# Patient Record
Sex: Female | Born: 1975 | Race: White | Hispanic: No | Marital: Married | Smoking: Former smoker
Health system: Southern US, Community
[De-identification: ages and names within clinical notes are randomized; demographics above are authoritative.]

---

## 1998-10-17 ENCOUNTER — Other Ambulatory Visit: Admission: RE | Admit: 1998-10-17 | Discharge: 1998-10-17 | Payer: Self-pay | Admitting: Obstetrics and Gynecology

## 1999-05-04 ENCOUNTER — Other Ambulatory Visit: Admission: RE | Admit: 1999-05-04 | Discharge: 1999-05-04 | Payer: Self-pay | Admitting: Obstetrics and Gynecology

## 1999-11-03 ENCOUNTER — Other Ambulatory Visit: Admission: RE | Admit: 1999-11-03 | Discharge: 1999-11-03 | Payer: Self-pay | Admitting: Obstetrics and Gynecology

## 2000-08-15 ENCOUNTER — Other Ambulatory Visit: Admission: RE | Admit: 2000-08-15 | Discharge: 2000-08-15 | Payer: Self-pay | Admitting: Obstetrics and Gynecology

## 2000-09-06 ENCOUNTER — Encounter (INDEPENDENT_AMBULATORY_CARE_PROVIDER_SITE_OTHER): Payer: Self-pay

## 2000-09-06 ENCOUNTER — Other Ambulatory Visit: Admission: RE | Admit: 2000-09-06 | Discharge: 2000-09-06 | Payer: Self-pay | Admitting: Obstetrics and Gynecology

## 2001-03-21 ENCOUNTER — Other Ambulatory Visit: Admission: RE | Admit: 2001-03-21 | Discharge: 2001-03-21 | Payer: Self-pay | Admitting: Obstetrics and Gynecology

## 2001-12-16 ENCOUNTER — Other Ambulatory Visit: Admission: RE | Admit: 2001-12-16 | Discharge: 2001-12-16 | Payer: Self-pay | Admitting: Obstetrics and Gynecology

## 2003-11-22 ENCOUNTER — Other Ambulatory Visit: Admission: RE | Admit: 2003-11-22 | Discharge: 2003-11-22 | Payer: Self-pay | Admitting: Gynecology

## 2004-06-05 ENCOUNTER — Other Ambulatory Visit: Admission: RE | Admit: 2004-06-05 | Discharge: 2004-06-05 | Payer: Self-pay | Admitting: Gynecology

## 2005-08-06 ENCOUNTER — Other Ambulatory Visit: Admission: RE | Admit: 2005-08-06 | Discharge: 2005-08-06 | Payer: Self-pay | Admitting: Gynecology

## 2005-12-04 ENCOUNTER — Other Ambulatory Visit: Admission: RE | Admit: 2005-12-04 | Discharge: 2005-12-04 | Payer: Self-pay | Admitting: Gynecology

## 2006-10-02 ENCOUNTER — Other Ambulatory Visit: Admission: RE | Admit: 2006-10-02 | Discharge: 2006-10-02 | Payer: Self-pay | Admitting: Gynecology

## 2007-06-25 ENCOUNTER — Other Ambulatory Visit: Admission: RE | Admit: 2007-06-25 | Discharge: 2007-06-25 | Payer: Self-pay | Admitting: Gynecology

## 2009-11-03 ENCOUNTER — Inpatient Hospital Stay (HOSPITAL_COMMUNITY): Admission: AD | Admit: 2009-11-03 | Discharge: 2009-11-03 | Payer: Self-pay | Admitting: Gynecology

## 2009-11-08 ENCOUNTER — Ambulatory Visit: Payer: Self-pay | Admitting: Obstetrics and Gynecology

## 2009-11-08 ENCOUNTER — Ambulatory Visit (HOSPITAL_COMMUNITY): Admission: RE | Admit: 2009-11-08 | Discharge: 2009-11-08 | Payer: Self-pay | Admitting: Gynecology

## 2010-10-03 LAB — CBC
HCT: 35 % — ABNORMAL LOW (ref 36.0–46.0)
Hemoglobin: 12 g/dL (ref 12.0–15.0)
Hemoglobin: 12.8 g/dL (ref 12.0–15.0)
MCHC: 34.3 g/dL (ref 30.0–36.0)
MCHC: 34.7 g/dL (ref 30.0–36.0)
MCV: 91.8 fL (ref 78.0–100.0)
Platelets: 184 10*3/uL (ref 150–400)
RBC: 3.81 MIL/uL — ABNORMAL LOW (ref 3.87–5.11)
RDW: 12.7 % (ref 11.5–15.5)
RDW: 12.9 % (ref 11.5–15.5)
WBC: 4.9 10*3/uL (ref 4.0–10.5)

## 2010-10-03 LAB — CREATININE, SERUM
Creatinine, Ser: 0.85 mg/dL (ref 0.4–1.2)
Creatinine, Ser: 0.85 mg/dL (ref 0.4–1.2)
GFR calc Af Amer: >60 mL/min (ref >60)
GFR calc Af Amer: >60 mL/min (ref >60)
GFR calc non Af Amer: >60 mL/min (ref >60)
GFR calc non Af Amer: >60 mL/min (ref >60)

## 2010-10-03 LAB — DIFFERENTIAL
Basophils Absolute: 0 10*3/uL (ref 0.0–0.1)
Basophils Absolute: 0.1 10*3/uL (ref 0.0–0.1)
Basophils Relative: 1 % (ref 0–1)
Basophils Relative: 1 % (ref 0–1)
Eosinophils Absolute: 0 10*3/uL (ref 0.0–0.7)
Eosinophils Absolute: 0.1 10*3/uL (ref 0.0–0.7)
Eosinophils Relative: 1 % (ref 0–5)
Lymphocytes Relative: 26 % (ref 12–46)
Lymphocytes Relative: 38 % (ref 12–46)
Lymphs Abs: 1.3 10*3/uL (ref 0.7–4.0)
Lymphs Abs: 2.3 10*3/uL (ref 0.7–4.0)
Monocytes Absolute: 0.4 10*3/uL (ref 0.1–1.0)
Monocytes Relative: 7 % (ref 3–12)
Neutro Abs: 3.1 10*3/uL (ref 1.7–7.7)
Neutro Abs: 3.3 10*3/uL (ref 1.7–7.7)
Neutrophils Relative %: 55 % (ref 43–77)

## 2010-10-03 LAB — BUN
BUN: 13 mg/dL (ref 6–23)
BUN: 14 mg/dL (ref 6–23)

## 2010-10-03 LAB — AST
AST: 20 U/L (ref 0–37)
AST: 22 U/L (ref 0–37)

## 2014-09-08 ENCOUNTER — Ambulatory Visit (INDEPENDENT_AMBULATORY_CARE_PROVIDER_SITE_OTHER): Payer: 59 | Admitting: Emergency Medicine

## 2014-09-08 ENCOUNTER — Ambulatory Visit (INDEPENDENT_AMBULATORY_CARE_PROVIDER_SITE_OTHER): Payer: 59

## 2014-09-08 VITALS — BP 114/62 | HR 98 | Temp 98.5°F | Resp 18 | Ht 65.0 in | Wt 146.0 lb

## 2014-09-08 DIAGNOSIS — J029 Acute pharyngitis, unspecified: Secondary | ICD-10-CM

## 2014-09-08 DIAGNOSIS — R5383 Other fatigue: Secondary | ICD-10-CM

## 2014-09-08 LAB — POCT CBC
Granulocyte percent: 65.5 %G (ref 37–80)
HEMATOCRIT: 37.7 % (ref 37.7–47.9)
Hemoglobin: 12.4 g/dL (ref 12.2–16.2)
LYMPH, POC: 2 (ref 0.6–3.4)
MCH, POC: 29.6 pg (ref 27–31.2)
MCHC: 33 g/dL (ref 31.8–35.4)
MCV: 89.6 fL (ref 80–97)
MID (CBC): 0.1 (ref 0–0.9)
MPV: 7.6 fL (ref 0–99.8)
PLATELET COUNT, POC: 242 10*3/uL (ref 142–424)
POC GRANULOCYTE: 3.9 (ref 2–6.9)
POC LYMPH %: 32.5 % (ref 10–50)
POC MID %: 2 %M (ref 0–12)
RBC: 4.21 M/uL (ref 4.04–5.48)
RDW, POC: 12.8 %
WBC: 6 10*3/uL (ref 4.6–10.2)

## 2014-09-08 LAB — POCT URINE PREGNANCY: PREG TEST UR: NEGATIVE

## 2014-09-08 LAB — GLUCOSE, POCT (MANUAL RESULT ENTRY): POC GLUCOSE: 118 mg/dL — AB (ref 70–99)

## 2014-09-08 LAB — POCT RAPID STREP A (OFFICE): Rapid Strep A Screen: NEGATIVE

## 2014-09-08 NOTE — Progress Notes (Signed)
This chart was scribed for Collene Gobble, MD by Tonye Royalty, ED Scribe. This patient was seen in room 12 and the patient's care was started at 2:34 PM.   Subjective:    Patient ID: Garald Ruiz, female    DOB: 1976-05-02, 39 y.o.   MRN: [id number]  HPI  HPI Comments: Tracey Ruiz is a 39 y.o. female who presents to the [facility] complaining of cough with onset a few weeks, worsening this past week.She reports associated fatigue, mild headache, sore throat, head congestion, and generally not feeling well. She states symptoms mostly affect her in the morning but a few times have bothered her during the day. She states she has coughed up phlegm a few times. She has not checked her temperature. She notes hiostry of pneumonia. She reports sick contacts at work. She quit smoking in October. She states she is currently on her period. She states she does not use birth control or any other method of contraception but has a long term partner and they are trying not to get pregnant. She notes ectopic pregnancy 5 years ago.   Review of Systems  Constitutional: Positive for fatigue. Negative for fever.  HENT: Positive for congestion and sore throat.   Respiratory: Positive for cough.   Neurological: Positive for headaches.  All other systems reviewed and are negative.      Objective:   Physical Exam  Nursing note and vitals reviewed.   CONSTITUTIONAL: Well developed/well nourished HEAD: Normocephalic/atraumatic EYES: EOMI/PERRL ENMT: Mucous membranes moist, minimal redness to posterior pharynx NECK: supple no meningeal signs SPINE/BACK:entire spine nontender CV: S1/S2 noted, no murmurs/rubs/gallops noted LUNGS: Lungs are clear to auscultation bilaterally, no apparent distress ABDOMEN: soft, nontender, no rebound or guarding, bowel sounds noted throughout abdomen GU:no cva tenderness NEURO: Pt is awake/alert/appropriate, moves all extremitiesx4.  No facial droop.     EXTREMITIES: pulses normal/equal, full ROM SKIN: warm, color normal PSYCH: no abnormalities of mood noted, alert and oriented to situation  Results for orders placed or performed in visit on 09/08/14  POCT CBC  Result Value Ref Range   WBC 6.0 4.6 - 10.2 K/uL   Lymph, poc 2.0 0.6 - 3.4   POC LYMPH PERCENT 32.5 10 - 50 %L   MID (cbc) 0.1 0 - 0.9   POC MID % 2.0 0 - 12 %M   POC Granulocyte 3.9 2 - 6.9   Granulocyte percent 65.5 37 - 80 %G   RBC 4.21 4.04 - 5.48 M/uL   Hemoglobin 12.4 12.2 - 16.2 g/dL   HCT, POC 40.9 81.1 - 47.9 %   MCV 89.6 80 - 97 fL   MCH, POC 29.6 27 - 31.2 pg   MCHC 33.0 31.8 - 35.4 g/dL   RDW, POC 91.4 %   Platelet Count, POC 242 142 - 424 K/uL   MPV 7.6 0 - 99.8 fL  POCT glucose (manual entry)  Result Value Ref Range   POC Glucose 118 (A) 70 - 99 mg/dl  POCT urine pregnancy  Result Value Ref Range   Preg Test, Ur Negative   POCT rapid strep A  Result Value Ref Range   Rapid Strep A Screen Negative Negative  [facility] reading (PRIMARY) by  Dr. Cleta Alberts no acute disease.     Assessment & Plan:  I suspect patient has had a viral type illness. Her random sugar was up slightly. Her chest x-ray does not show any acute problem and her strep test is  negative we'll await the results of her TSH and comprehensive metabolic panel. I personally performed the services described in this documentation, which was scribed in my presence. The recorded information has been reviewed and is accurate.

## 2014-09-09 LAB — COMPREHENSIVE METABOLIC PANEL
ALT: 14 U/L (ref 0–35)
AST: 14 U/L (ref 0–37)
Albumin: 4.1 g/dL (ref 3.5–5.2)
Alkaline Phosphatase: 39 U/L (ref 39–117)
BUN: 17 mg/dL (ref 6–23)
CO2: 26 mEq/L (ref 19–32)
CREATININE: 1.03 mg/dL (ref 0.50–1.10)
Calcium: 8.9 mg/dL (ref 8.4–10.5)
Chloride: 103 mEq/L (ref 96–112)
Glucose, Bld: 115 mg/dL — ABNORMAL HIGH (ref 70–99)
Potassium: 3.8 mEq/L (ref 3.5–5.3)
SODIUM: 137 meq/L (ref 135–145)
TOTAL PROTEIN: 6.5 g/dL (ref 6.0–8.3)
Total Bilirubin: 0.9 mg/dL (ref 0.2–1.2)

## 2014-09-09 LAB — TSH: TSH: 1.556 u[IU]/mL (ref 0.350–4.500)

## 2015-09-27 IMAGING — CR DG CHEST 1V
1 series · 1 of 1 positions shown · non-contrast
Comparison: None.

CLINICAL DATA: Several weeks of cough and fatigue

EXAM:
CHEST  1 VIEW

[PA]
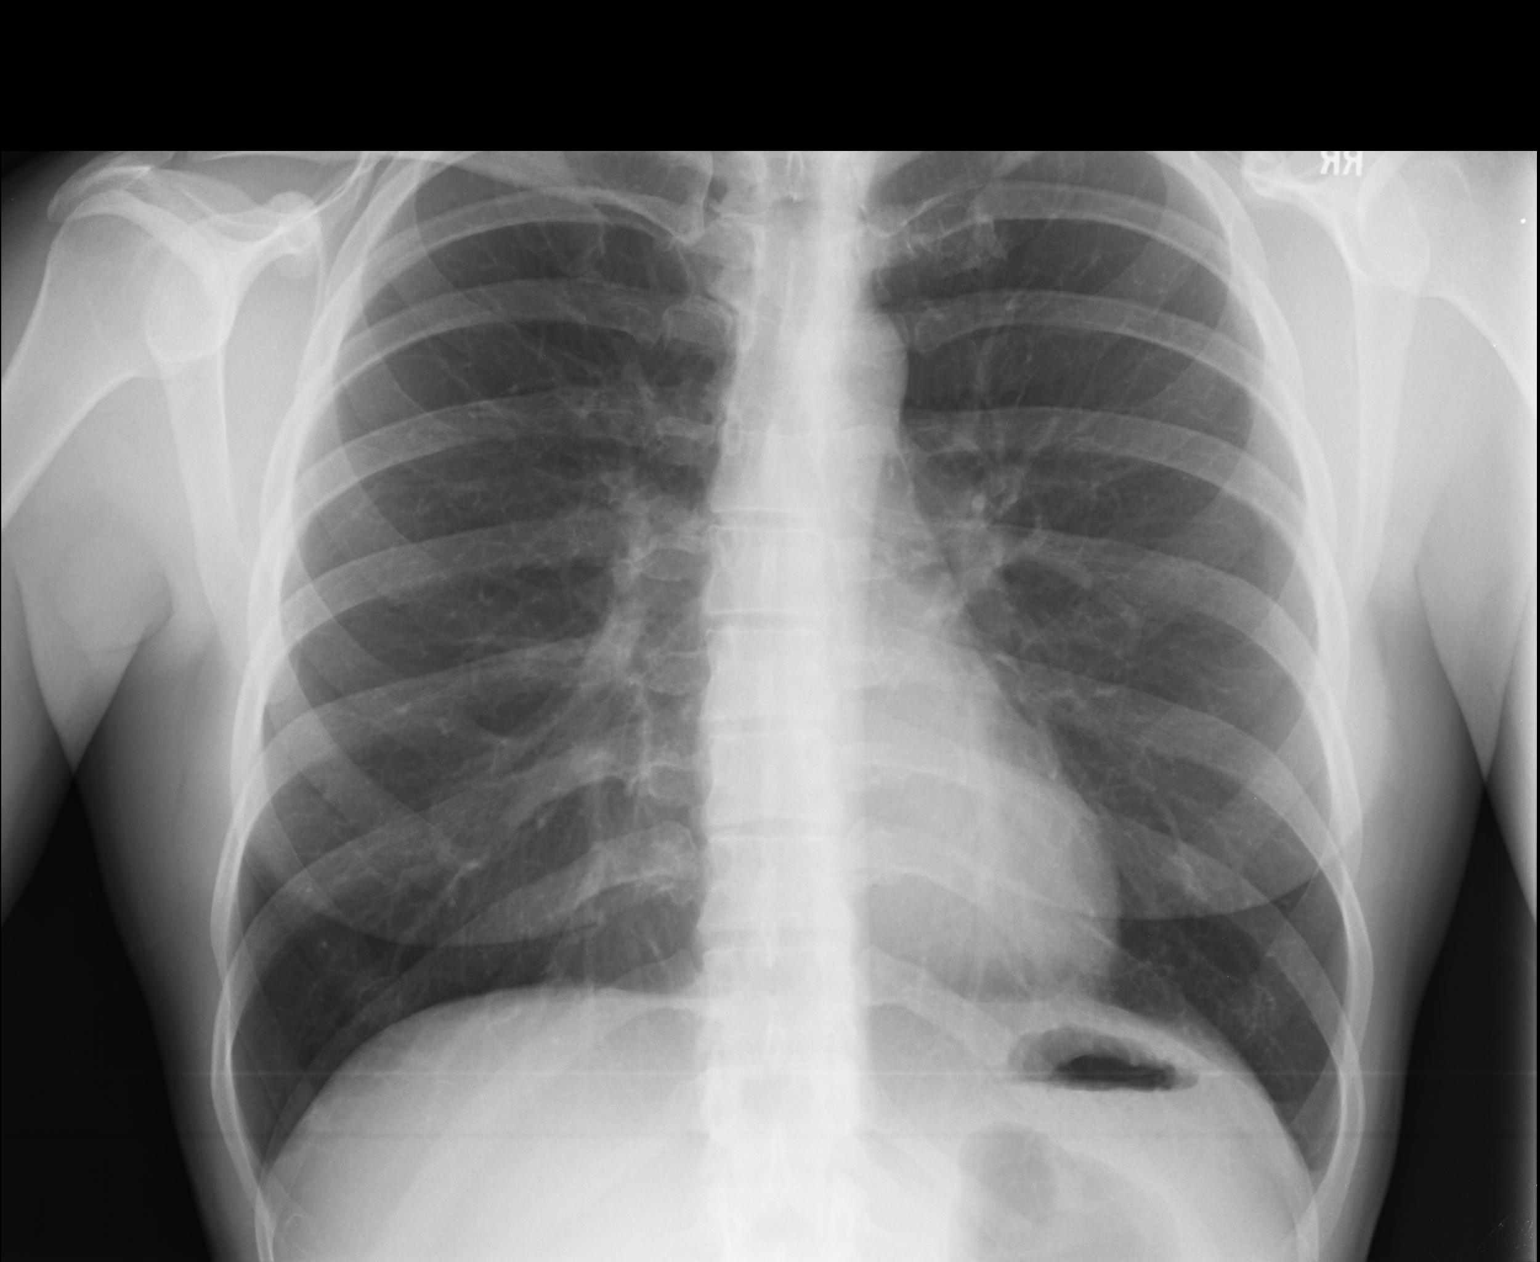

[1 of 1 positions shown; findings below may reference images not displayed]

FINDINGS: Lungs are clear. Heart size and pulmonary vascularity are normal. No
adenopathy. No bone lesions.
IMPRESSION: No edema or consolidation.

## 2018-11-26 ENCOUNTER — Telehealth: Payer: Self-pay | Admitting: Nurse Practitioner

## 2018-11-26 DIAGNOSIS — R509 Fever, unspecified: Secondary | ICD-10-CM

## 2018-11-26 DIAGNOSIS — R102 Pelvic and perineal pain: Secondary | ICD-10-CM

## 2018-11-26 NOTE — Progress Notes (Signed)
Based on what you shared with me it looks like you have something,that should be evaluated in a face to face office visit. You need a pelvic exam and some blood work   NOTE: If you entered your credit card information for this eVisit, you will not be charged. You may see a "hold" on your card for the $30 but that hold will drop off and you will not have a charge processed.  If you are having a true medical emergency please call 911.  If you need an urgent face to face visit, [facility] has four urgent care centers for your convenience.  If you need care fast and have a high deductible or no insurance consider:   [email or web address] to reserve your spot online an avoid wait times  [facility] [address] [place], [place] [address] 8 am to 8 pm Monday-Friday 10 am to 4 pm Saturday-Sunday *Across the street from [employer]  [address] [place], [address] 8 am to 5 pm Monday-Friday * In the [facility] on the [facility]   The following sites will take your  insurance:  . [facility]  [phone] Get Driving Directions Find a Provider at this Location  [address] [place], [place] [address] . 10 am to 8 pm Monday-Friday . 12 pm to 8 pm Saturday-Sunday   . [facility] at [facility]  [phone] Get Driving Directions Find a Provider at this Location  [address] [place], [place] [address] . 8 am to 8 pm Monday-Friday . 9 am to 6 pm Saturday . 11 am to 6 pm Sunday   . [facility] at [facility]  [phone] Get Driving Directions  [address] [place], [place] [address] . 8 am to 8 pm Monday-Friday . 8 am to 4 pm Saturday-Sunday   Your e-visit answers were reviewed by a board certified advanced clinical practitioner to complete your personal care plan.  5-10 minutes spent reviewing and documenting  in chart.

## 2020-06-17 ENCOUNTER — Ambulatory Visit: Payer: Self-pay | Attending: Internal Medicine

## 2020-06-17 DIAGNOSIS — Z23 Encounter for immunization: Secondary | ICD-10-CM

## 2022-04-23 ENCOUNTER — Encounter: Payer: Self-pay | Admitting: Gastroenterology

## 2022-05-21 ENCOUNTER — Encounter: Payer: Self-pay | Admitting: Gastroenterology

## 2022-11-28 ENCOUNTER — Encounter: Payer: BLUE CROSS/BLUE SHIELD | Admitting: Gastroenterology

## 2022-12-27 ENCOUNTER — Encounter: Payer: BLUE CROSS/BLUE SHIELD | Admitting: Radiology

## 2023-10-08 DIAGNOSIS — Z130 Encounter for screening for diseases of the blood and blood-forming organs and certain disorders involving the immune mechanism: Secondary | ICD-10-CM | POA: Diagnosis not present

## 2023-10-08 DIAGNOSIS — Z1389 Encounter for screening for other disorder: Secondary | ICD-10-CM | POA: Diagnosis not present

## 2023-10-08 DIAGNOSIS — Z01419 Encounter for gynecological examination (general) (routine) without abnormal findings: Secondary | ICD-10-CM | POA: Diagnosis not present

## 2023-10-08 DIAGNOSIS — Z113 Encounter for screening for infections with a predominantly sexual mode of transmission: Secondary | ICD-10-CM | POA: Diagnosis not present

## 2023-10-08 DIAGNOSIS — Z1231 Encounter for screening mammogram for malignant neoplasm of breast: Secondary | ICD-10-CM | POA: Diagnosis not present

## 2024-02-11 DIAGNOSIS — R7989 Other specified abnormal findings of blood chemistry: Secondary | ICD-10-CM | POA: Diagnosis not present

## 2024-02-11 DIAGNOSIS — K219 Gastro-esophageal reflux disease without esophagitis: Secondary | ICD-10-CM | POA: Diagnosis not present

## 2024-02-11 DIAGNOSIS — Z79899 Other long term (current) drug therapy: Secondary | ICD-10-CM | POA: Diagnosis not present

## 2024-02-18 DIAGNOSIS — F902 Attention-deficit hyperactivity disorder, combined type: Secondary | ICD-10-CM | POA: Diagnosis not present

## 2024-02-18 DIAGNOSIS — E669 Obesity, unspecified: Secondary | ICD-10-CM | POA: Diagnosis not present

## 2024-02-18 DIAGNOSIS — Z1339 Encounter for screening examination for other mental health and behavioral disorders: Secondary | ICD-10-CM | POA: Diagnosis not present

## 2024-02-18 DIAGNOSIS — Z0000 Encounter for general adult medical examination without abnormal findings: Secondary | ICD-10-CM | POA: Diagnosis not present

## 2024-02-18 DIAGNOSIS — R7989 Other specified abnormal findings of blood chemistry: Secondary | ICD-10-CM | POA: Diagnosis not present

## 2024-02-18 DIAGNOSIS — J069 Acute upper respiratory infection, unspecified: Secondary | ICD-10-CM | POA: Diagnosis not present

## 2024-02-18 DIAGNOSIS — Z6830 Body mass index (BMI) 30.0-30.9, adult: Secondary | ICD-10-CM | POA: Diagnosis not present

## 2024-02-18 DIAGNOSIS — F439 Reaction to severe stress, unspecified: Secondary | ICD-10-CM | POA: Diagnosis not present

## 2024-02-18 DIAGNOSIS — Z1389 Encounter for screening for other disorder: Secondary | ICD-10-CM | POA: Diagnosis not present

## 2024-02-18 DIAGNOSIS — Z1331 Encounter for screening for depression: Secondary | ICD-10-CM | POA: Diagnosis not present

## 2024-02-18 DIAGNOSIS — F419 Anxiety disorder, unspecified: Secondary | ICD-10-CM | POA: Diagnosis not present

## 2024-02-18 DIAGNOSIS — K219 Gastro-esophageal reflux disease without esophagitis: Secondary | ICD-10-CM | POA: Diagnosis not present

## 2024-02-19 ENCOUNTER — Encounter (INDEPENDENT_AMBULATORY_CARE_PROVIDER_SITE_OTHER): Payer: Self-pay

## 2024-04-07 DIAGNOSIS — Z7251 High risk heterosexual behavior: Secondary | ICD-10-CM | POA: Diagnosis not present

## 2024-04-14 DIAGNOSIS — F902 Attention-deficit hyperactivity disorder, combined type: Secondary | ICD-10-CM | POA: Diagnosis not present

## 2024-04-14 DIAGNOSIS — F411 Generalized anxiety disorder: Secondary | ICD-10-CM | POA: Diagnosis not present

## 2024-04-14 DIAGNOSIS — F4312 Post-traumatic stress disorder, chronic: Secondary | ICD-10-CM | POA: Diagnosis not present

## 2024-04-14 DIAGNOSIS — F410 Panic disorder [episodic paroxysmal anxiety] without agoraphobia: Secondary | ICD-10-CM | POA: Diagnosis not present

## 2024-04-28 DIAGNOSIS — F902 Attention-deficit hyperactivity disorder, combined type: Secondary | ICD-10-CM | POA: Diagnosis not present

## 2024-05-12 DIAGNOSIS — F902 Attention-deficit hyperactivity disorder, combined type: Secondary | ICD-10-CM | POA: Diagnosis not present

## 2024-05-12 DIAGNOSIS — F4312 Post-traumatic stress disorder, chronic: Secondary | ICD-10-CM | POA: Diagnosis not present

## 2024-05-12 DIAGNOSIS — F411 Generalized anxiety disorder: Secondary | ICD-10-CM | POA: Diagnosis not present

## 2024-05-25 DIAGNOSIS — F439 Reaction to severe stress, unspecified: Secondary | ICD-10-CM | POA: Diagnosis not present

## 2024-05-25 DIAGNOSIS — R030 Elevated blood-pressure reading, without diagnosis of hypertension: Secondary | ICD-10-CM | POA: Diagnosis not present

## 2024-05-25 DIAGNOSIS — F902 Attention-deficit hyperactivity disorder, combined type: Secondary | ICD-10-CM | POA: Diagnosis not present

## 2024-05-25 DIAGNOSIS — F419 Anxiety disorder, unspecified: Secondary | ICD-10-CM | POA: Diagnosis not present

## 2024-05-25 DIAGNOSIS — I10 Essential (primary) hypertension: Secondary | ICD-10-CM | POA: Diagnosis not present
